# Patient Record
Sex: Male | Born: 1955 | Race: White | Hispanic: No | Smoking: Never smoker
Health system: Southern US, Community
[De-identification: ages and names within clinical notes are randomized; demographics above are authoritative.]

## PROBLEM LIST (undated history)

## (undated) DIAGNOSIS — B182 Chronic viral hepatitis C: Secondary | ICD-10-CM

## (undated) DIAGNOSIS — G8929 Other chronic pain: Secondary | ICD-10-CM

## (undated) DIAGNOSIS — K7460 Unspecified cirrhosis of liver: Secondary | ICD-10-CM

## (undated) DIAGNOSIS — N200 Calculus of kidney: Secondary | ICD-10-CM

## (undated) DIAGNOSIS — I10 Essential (primary) hypertension: Secondary | ICD-10-CM

## (undated) DIAGNOSIS — M549 Dorsalgia, unspecified: Secondary | ICD-10-CM

## (undated) HISTORY — DX: Essential (primary) hypertension: I10

## (undated) HISTORY — DX: Other chronic pain: G89.29

## (undated) HISTORY — DX: Dorsalgia, unspecified: M54.9

## (undated) HISTORY — PX: ANTERIOR CERVICAL DISCECTOMY: SHX1160

## (undated) HISTORY — DX: Morbid (severe) obesity due to excess calories: E66.01

## (undated) HISTORY — DX: Chronic viral hepatitis C: B18.2

## (undated) HISTORY — DX: Calculus of kidney: N20.0

## (undated) HISTORY — DX: Unspecified cirrhosis of liver: K74.60

---
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
## 2009-05-12 ENCOUNTER — Ambulatory Visit (HOSPITAL_COMMUNITY)
Admission: RE | Admit: 2009-05-12 | Discharge: 2009-05-12 | Payer: Self-pay | Source: Home / Self Care | Admitting: Neurosurgery

## 2010-08-18 ENCOUNTER — Other Ambulatory Visit (HOSPITAL_COMMUNITY): Payer: Self-pay | Admitting: Orthopedic Surgery

## 2010-08-18 ENCOUNTER — Encounter (HOSPITAL_COMMUNITY)
Admission: RE | Admit: 2010-08-18 | Discharge: 2010-08-18 | Disposition: A | Discharge status: Home / Self Care | Payer: Worker's Compensation | Source: Ambulatory Visit | Attending: Orthopedic Surgery | Admitting: Orthopedic Surgery

## 2010-08-18 ENCOUNTER — Ambulatory Visit (HOSPITAL_COMMUNITY)
Admission: RE | Admit: 2010-08-18 | Discharge: 2010-08-18 | Disposition: A | Discharge status: Home / Self Care | Payer: Worker's Compensation | Source: Ambulatory Visit | Attending: Orthopedic Surgery | Admitting: Orthopedic Surgery

## 2010-08-18 DIAGNOSIS — Z01818 Encounter for other preprocedural examination: Secondary | ICD-10-CM | POA: Insufficient documentation

## 2010-08-18 DIAGNOSIS — Z01811 Encounter for preprocedural respiratory examination: Secondary | ICD-10-CM

## 2010-08-18 DIAGNOSIS — Z01812 Encounter for preprocedural laboratory examination: Secondary | ICD-10-CM | POA: Insufficient documentation

## 2010-08-18 LAB — CBC
HCT: 46 % (ref 39.0–52.0)
Hemoglobin: 16.2 g/dL (ref 13.0–17.0)
MCV: 85.2 fL (ref 78.0–100.0)
RBC: 5.4 MIL/uL (ref 4.22–5.81)
WBC: 8.7 10*3/uL (ref 4.0–10.5)

## 2010-08-18 LAB — BASIC METABOLIC PANEL
CO2: 27 mEq/L (ref 19–32)
Chloride: 102 mEq/L (ref 96–112)
GFR calc Af Amer: >60 mL/min (ref >60)
Sodium: 139 mEq/L (ref 135–145)

## 2010-08-18 LAB — SURGICAL PCR SCREEN: MRSA, PCR: NEGATIVE

## 2010-08-19 ENCOUNTER — Ambulatory Visit (HOSPITAL_COMMUNITY)
Admission: RE | Admit: 2010-08-19 | Discharge: 2010-08-20 | Disposition: A | Discharge status: Home / Self Care | Payer: Worker's Compensation | Source: Ambulatory Visit | Attending: Orthopedic Surgery | Admitting: Orthopedic Surgery

## 2010-08-19 ENCOUNTER — Ambulatory Visit (HOSPITAL_COMMUNITY): Payer: Worker's Compensation

## 2010-08-19 DIAGNOSIS — Z01812 Encounter for preprocedural laboratory examination: Secondary | ICD-10-CM | POA: Insufficient documentation

## 2010-08-19 DIAGNOSIS — M47812 Spondylosis without myelopathy or radiculopathy, cervical region: Secondary | ICD-10-CM | POA: Insufficient documentation

## 2010-08-19 DIAGNOSIS — I10 Essential (primary) hypertension: Secondary | ICD-10-CM | POA: Insufficient documentation

## 2010-08-19 DIAGNOSIS — Z01818 Encounter for other preprocedural examination: Secondary | ICD-10-CM | POA: Insufficient documentation

## 2010-08-19 DIAGNOSIS — Z01810 Encounter for preprocedural cardiovascular examination: Secondary | ICD-10-CM | POA: Insufficient documentation

## 2010-08-21 NOTE — Op Note (Signed)
Jerome Snow, Jerome [name]:  [id number]  MEDICAL RECORD NO.:  [id number]  LOCATION:  5023                         FACILITY:  [facility]  PHYSICIAN:  Alvy Beal, MD    DATE OF BIRTH:  April 28, 1955  DATE OF PROCEDURE:  08/19/2010 DATE OF DISCHARGE:                              OPERATIVE REPORT   PREOPERATIVE DIAGNOSIS:  Cervical spondylotic radiculopathy.  POSTOPERATIVE DIAGNOSIS:  Cervical spondylotic radiculopathy.  OPERATIVE PROCEDURE:  Anterior cervical diskectomy and fusion C5-C6, C6- C7.  SURGEON:  Alvy Beal, MD  FIRST ASSISTANT:  Norval Gable, PA  INSTRUMENTATION SYSTEM USED:  Synthes anterior cervical Vectra plate 34 mm long with 16-mm screws into the body of C5, 14-mm rescue screws into the body of C6, 14-mm variable angle screws into the body of C7, and 8- mm tightened large lordotic intervertebral cages packed with DBX mix.  COMPLICATIONS:  None.  CONDITION:  Stable.  HISTORY:  This is a very pleasant 55 year old morbidly gentleman who has been having progressive disabling neck and radicular arm pain.  Despite prolonged attempts at conservative management, he continued to have severe disabling pain with poor quality of life.  As a result, we discussed the surgical solution to his problem.  All appropriate risks, benefits, and alternatives were discussed with the patient and consent was obtained.  OPERATIVE NOTE:  The patient was brought to the operating room and placed supine on the operating table.  After successful induction of general anesthesia and endotracheal intubation, SCDs were applied.  The arms were tucked at the side and wrist straps were applied.  The anterior cervical spine was prepped and draped in a standard fashion.  Appropriate time-out was then done to confirm patient, procedure, affected extremity, and all other pertinent important data.  Once this was done, a longitudinal left-sided incision was made just along  the medial border of the sternocleidomastoid.  Sharp dissection was carried out down to and through the platysma.  I then sharply dissected through the deep cervical fascia sweeping the trachea and ultimately esophagus medially and protecting with a finger until I could identify the omohyoid muscle.  The omohyoid muscle was transected which allowed for much easier retraction and visualization.  I continued to dissect sharply to the deep cervical fascia and the prevertebral fascia. I then placed a retractor to retract the esophagus and trachea medially on the right-hand side and then identified the carotid pulsation and protected the carotid sheath with a finger on the lateral side.  I then continued to mobilize the prevertebral fascia to completely see the anterior longitudinal ligament from the midbody of C5 to midbody of C7. I then placed a needle into the C5-C6 disk space, took a lateral intraoperative x-ray, and confirmed that I was at the appropriate level. Once I had confirmed I was at the appropriate level, I then proceeded with mobilizing the longus colli muscle.  Using bipolar electrocautery, I mobilized the longus colli muscle from the midbody of C5 to the midbody of C7 bilaterally.  I then placed a 55-mm self-retaining Caspar retractor underneath the longus colli muscle and then deflated the endotracheal cuff, expanded the retractor blades, and then reinflated  the endotracheal cuff.  At this point, I had excellent visualization of the anterior cervical spine.  Using a double-action Leksell rongeur, I removed the very large anterior osteophytes at the disk space C5-C6 and C6-C7.  At this point with the anterior cervical spine visualized, I placed distraction pins into the body of C6 and C7 and gently distracted the C6-C7 disk space.  A #15 blade scalpel was used to perform an annulotomy.  Then using a combination of pituitary rongeurs, curettes, and Kerrison rongeurs, I removed  all the disk material at the C5-C6 disk space.  Once this was completed, I then used a fine nerve hook to develop a plane underneath the posterior longitudinal ligament and I used a 1-mm Kerrison to completely resect the posterior longitudinal ligament.  At this point, I could now sweep underneath the longus colli muscle with my nerve hook and removed any remaining soft disk herniation.  I also then took my 1- mm Kerrison, went underneath the uncovertebral joint and trimmed down the osteophyte.  At this point, I irrigated copiously with normal saline, rasped the endplates until I had bleeding subchondral bone.  I was quite pleased with the decompression at C6-C7 and I was able to place a large 8-mm lordotic Titan cage packed with 2.5 mL of Osteocel. I had good compression.  At this point, I removed the distraction pin at C7, repositioned it at C5, and using the same technique completely resected the C5-C6 disk.  Again, I took down the posterior longitudinal ligament.  I was able to clearly sweep behind the vertebral body, resected any residual osteophyte.  Once I was satisfied with the decompression, I rasped the endplates and placed the same sized graft with 2.5 mL of Osteocel into the cage.  Once both cages were properly positioned, I irrigated the wound copiously with normal saline and then contoured a 34-mm anterior cervical plate and secured it with 16-mm variable angle screws into the body of C5 and 14-mm screws into the body of C7.  All screws were torqued down.  I then properly locked through the capture ring of the plate.  I then placed 2 screws at C6.  I was less pleased with the purchase what they got and so I took them out and replaced them in a slightly different direction and at this time used rescue screws and got a good purchase.  I then copiously irrigated the wounds with normal saline, used bipolar electrocautery to obtain hemostasis, and maintained it with FloSeal.  I  then checked the esophagus to ensure that it did not become entrapped underneath the plate during the application of the Vectra plate.  Once I had checked hemostasis again, I returned the esophagus and trachea back to midline again confirming that it was not injured during the application of plate.  I then closed the platysma with interrupted 2-0 Vicryl suture and a 3-0 running Monocryl for the skin.  Steri-Strips and dry dressing were applied.  The patient was ultimately extubated and transferred to PACU without incident.  At the end of the case, all needle and sponge counts were correct.     Alvy Beal, MD     DDB/MEDQ  D:  08/19/2010  T:  08/20/2010  Job:  [id number]  Electronically Signed by Venita Lick MD on 08/21/2010 08:14:58 AM

## 2010-08-23 ENCOUNTER — Emergency Department (HOSPITAL_COMMUNITY): Payer: Worker's Compensation

## 2010-08-23 ENCOUNTER — Inpatient Hospital Stay (HOSPITAL_COMMUNITY)
Admission: EM | Admit: 2010-08-23 | Discharge: 2010-08-25 | DRG: 916 | Disposition: A | Discharge status: Home / Self Care | Payer: Worker's Compensation | Attending: Orthopedic Surgery | Admitting: Orthopedic Surgery

## 2010-08-23 DIAGNOSIS — Z981 Arthrodesis status: Secondary | ICD-10-CM

## 2010-08-23 DIAGNOSIS — T783XXA Angioneurotic edema, initial encounter: Principal | ICD-10-CM | POA: Diagnosis present

## 2010-08-23 DIAGNOSIS — Y831 Surgical operation with implant of artificial internal device as the cause of abnormal reaction of the patient, or of later complication, without mention of misadventure at the time of the procedure: Secondary | ICD-10-CM | POA: Diagnosis present

## 2010-08-23 DIAGNOSIS — K047 Periapical abscess without sinus: Secondary | ICD-10-CM | POA: Diagnosis present

## 2010-08-23 DIAGNOSIS — Y92009 Unspecified place in unspecified non-institutional (private) residence as the place of occurrence of the external cause: Secondary | ICD-10-CM

## 2010-08-23 LAB — CBC
MCHC: 34.9 g/dL (ref 30.0–36.0)
Platelets: 229 10*3/uL (ref 150–400)
RDW: 12.6 % (ref 11.5–15.5)
WBC: 9.5 10*3/uL (ref 4.0–10.5)

## 2010-08-23 LAB — DIFFERENTIAL
Basophils Absolute: 0 10*3/uL (ref 0.0–0.1)
Eosinophils Absolute: 0.1 10*3/uL (ref 0.0–0.7)
Eosinophils Relative: 1 % (ref 0–5)
Lymphocytes Relative: 16 % (ref 12–46)
Lymphs Abs: 1.5 10*3/uL (ref 0.7–4.0)
Neutro Abs: 7.1 10*3/uL (ref 1.7–7.7)
Neutrophils Relative %: 75 % (ref 43–77)

## 2010-08-23 LAB — BASIC METABOLIC PANEL
BUN: 13 mg/dL (ref 6–23)
CO2: 30 mEq/L (ref 19–32)
Calcium: 9.3 mg/dL (ref 8.4–10.5)
Chloride: 98 mEq/L (ref 96–112)
Creatinine, Ser: 0.67 mg/dL (ref 0.50–1.35)
Glucose, Bld: 120 mg/dL — ABNORMAL HIGH (ref 70–99)
Potassium: 4 mEq/L (ref 3.5–5.1)

## 2010-08-23 MED ORDER — IOHEXOL 300 MG/ML  SOLN: 75.0000 mL | Freq: Once | INTRAMUSCULAR | Status: AC | PRN

## 2010-09-09 NOTE — Discharge Summary (Signed)
  [name], [name]:  [id number]  MEDICAL RECORD NO.:  [id number]  LOCATION:  5038                         FACILITY:  [facility]  PHYSICIAN:  Jerome Beal, MD    DATE OF BIRTH:  [birth year]  DATE OF ADMISSION:  08/23/2010 DATE OF DISCHARGE:  08/25/2010                              DISCHARGE SUMMARY   ADMITTING DIAGNOSIS:  Airway problems (angioedema), status post anterior cervical diskectomy and fusion.  SECONDARY DIAGNOSIS:  Abscess, right tooth.  HISTORY:  This is a very pleasant 55 year old gentleman who last week underwent a multilevel anterior cervical diskectomy and fusion.  The surgery was up and was uneventful and he was ultimately discharged to home.  He presented to the emergency room on August 23, 2010 with complaints of difficulty breathing, shortness of breath, and difficulty swallowing.  CT scan at that time demonstrated airway angioedema, but no significant hematoma or external compression.  As a result, we admitted the patient for ICU observation and ongoing care.  The patient's past medical, surgical, family, and social history is outlined in his H and P and intake form, please refer to those visits.  HOSPITAL COURSE:  The patient was doing quite well.  He was transferred from the Step-Down Unit to the floor.  There has been no further episodes of swelling or airway compromise or difficulty breathing or shortness of breath.  The patient was complaining of significant mouth bleeding and he informed that he had an abscess tooth.  I attempted to get dental consult; however, this was quite difficult to get an inpatient dental consult.  I ultimately spoke to Dr. Chales Snow, oral surgeon and he advised giving the patient oral antibiotics and arrange follow up as an outpatient here with tooth.  The patient was okay with this.  Given the fact that his airway compromise had resolved, I will discharge him home today.  He will follow up with me as  planned.  No change to his discharge instructions with respect to his cervical spine. He will continue with all of his preoperative pain control meds and the only additional med will use today would be Keflex 500 mg p.o. q.i.d. for 10 days.     Jerome Beal, MD     DDB/MEDQ  D:  08/25/2010  T:  08/26/2010  Job:  [id number]  Electronically Signed by Jerome Lick MD on 09/09/2010 11:26:51 AM

## 2010-09-09 NOTE — H&P (Signed)
Jerome Snow, Jerome [name]:  [id number]  MEDICAL RECORD NO.:  [id number]  LOCATION:  [facility]                         FACILITY:  [facility]  PHYSICIAN:  Alvy Beal, MD    DATE OF BIRTH:  11/10/1955  DATE OF ADMISSION:  08/23/2010 DATE OF DISCHARGE:                             HISTORY & PHYSICAL   ADMITTING DIAGNOSES:  Airway angioedema status post anterior cervical diskectomy and fusion.  HISTORY:  This is a very pleasant 55 year old morbidly obese gentleman who last week underwent an anterior cervical diskectomy and fusion at C5- 6, C6-7.  Postoperatively he was doing quite well.  There were no significant complications.  He had no difficulty eating, swallowing, or breathing.  He was neurologically intact.  He was subsequently discharged to home.  Sunday postoperative day #3, he started noticing some difficulty swallowing and difficulty breathing.  The patient presented to the emergency room at approximately 4 a.m.  My partner Dr. Jillyn Hidden was notified and I was called to evaluate the patient.  Upon arrival to the emergency room, the patient noted that he was improving.  When he was sitting up, his breathing was better and there was less difficulty swallowing.  He still noted some issues when he would lay flat.  As a result of these findings, decision was made to admit the patient for further treatment.  His past medical, surgical family, social history includes morbid obesity.  He is a nonsmoker, nondrinker.  He has had chronic neck and back pain.  He is 4 days out from an anterior cervical diskectomy and fusion.  No drug allergies.  He is currently taking muscle relaxer, oxycodone, Colace.  CLINICAL EXAMINATION:  He is a pleasant gentleman who appears his stated age in no acute distress.  He is alert and oriented x3.  He is not having any labored breathing.  There is no stridor.  He is comfortable in the sitting position.  He has a regular rate and rhythm.   No tachycardia.  Respiration rate is 18 and normal.  His cervical incision site is clean, dry, and intact.  There is no significant drainage. There is some minor tenderness with direct palpation but no erythema. No expanding hematoma noted.  Abdomen is soft and nontender.  He has 5/5 strength in the upper extremity.  No shoulder, elbow, wrist pain with joint range of motion.  Negative Babinski test.  Symmetrical 2+ deep tendon reflexes.  Compartments are soft and nontender.  Intact peripheral pulses throughout.  CT scan was done of his cervical spine with contrast and I reviewed it with the radiologist.  The patient has only a very small fluid collection anterior to the C5 vertebral body, but no significant compression of the esophagus or trachea.  He does have significant angioedema with significant closure of the trachea at the level of about C4-5 disk, C4 vertebral body.  At this point in time, the patient has what appears to be angioedema. There does not appear to be any significant compressive force.  Given the fact there is no significant hematoma to actually decompress, I discussed with the patient and elected to observe him.  However, I am concerned about  the potential of an airway complication.  Given this, I am  going to admit the patient to the step-down bed, put him on tele and start IV Decadron.  My hope is to monitor his clinical exam.  As long as he is comfortable, he has no signs of labored breathing and he is tolerating a liquid diet, I think observation is adequate.  We will put an emergency trache set at the bedside in case he loses his airway and the trachea be done emergently.  I will notify the Anesthesia Service so that if there is an emergency they are at least aware of the patient.     Alvy Beal, MD     DDB/MEDQ  D:  08/23/2010  T:  08/23/2010  Job:  [id number]  Electronically Signed by Venita Lick MD on 09/09/2010 11:26:57 AM

## 2011-03-08 ENCOUNTER — Other Ambulatory Visit (HOSPITAL_COMMUNITY): Payer: Self-pay | Admitting: Chiropractic Medicine

## 2011-03-08 DIAGNOSIS — R52 Pain, unspecified: Secondary | ICD-10-CM

## 2011-03-09 ENCOUNTER — Other Ambulatory Visit (HOSPITAL_COMMUNITY): Payer: Self-pay | Admitting: Chiropractic Medicine

## 2011-03-09 DIAGNOSIS — R52 Pain, unspecified: Secondary | ICD-10-CM

## 2011-03-12 ENCOUNTER — Other Ambulatory Visit (HOSPITAL_COMMUNITY): Payer: Self-pay

## 2011-03-13 ENCOUNTER — Other Ambulatory Visit (HOSPITAL_COMMUNITY): Payer: Self-pay

## 2015-09-24 DIAGNOSIS — M5440 Lumbago with sciatica, unspecified side: Secondary | ICD-10-CM | POA: Diagnosis not present

## 2015-09-24 DIAGNOSIS — I10 Essential (primary) hypertension: Secondary | ICD-10-CM | POA: Diagnosis not present

## 2015-09-24 DIAGNOSIS — Z136 Encounter for screening for cardiovascular disorders: Secondary | ICD-10-CM | POA: Diagnosis not present

## 2015-10-16 DIAGNOSIS — Z1212 Encounter for screening for malignant neoplasm of rectum: Secondary | ICD-10-CM | POA: Diagnosis not present

## 2015-10-16 DIAGNOSIS — Z125 Encounter for screening for malignant neoplasm of prostate: Secondary | ICD-10-CM | POA: Diagnosis not present

## 2015-10-16 DIAGNOSIS — M5440 Lumbago with sciatica, unspecified side: Secondary | ICD-10-CM | POA: Diagnosis not present

## 2015-10-16 DIAGNOSIS — M25572 Pain in left ankle and joints of left foot: Secondary | ICD-10-CM | POA: Diagnosis not present

## 2015-10-16 DIAGNOSIS — Z0000 Encounter for general adult medical examination without abnormal findings: Secondary | ICD-10-CM | POA: Diagnosis not present

## 2015-10-21 DIAGNOSIS — M7989 Other specified soft tissue disorders: Secondary | ICD-10-CM | POA: Diagnosis not present

## 2015-10-21 DIAGNOSIS — M25572 Pain in left ankle and joints of left foot: Secondary | ICD-10-CM | POA: Diagnosis not present

## 2015-11-09 DIAGNOSIS — G8929 Other chronic pain: Secondary | ICD-10-CM | POA: Diagnosis not present

## 2015-11-09 DIAGNOSIS — G894 Chronic pain syndrome: Secondary | ICD-10-CM | POA: Diagnosis not present

## 2015-11-09 DIAGNOSIS — Z79899 Other long term (current) drug therapy: Secondary | ICD-10-CM | POA: Diagnosis not present

## 2015-11-09 DIAGNOSIS — I10 Essential (primary) hypertension: Secondary | ICD-10-CM | POA: Diagnosis not present

## 2015-12-07 DIAGNOSIS — M542 Cervicalgia: Secondary | ICD-10-CM | POA: Diagnosis not present

## 2015-12-07 DIAGNOSIS — I10 Essential (primary) hypertension: Secondary | ICD-10-CM | POA: Diagnosis not present

## 2015-12-07 DIAGNOSIS — G8929 Other chronic pain: Secondary | ICD-10-CM | POA: Diagnosis not present

## 2015-12-07 DIAGNOSIS — G894 Chronic pain syndrome: Secondary | ICD-10-CM | POA: Diagnosis not present

## 2017-05-10 ENCOUNTER — Other Ambulatory Visit (HOSPITAL_COMMUNITY): Payer: Self-pay | Admitting: Nurse Practitioner

## 2017-05-10 DIAGNOSIS — B182 Chronic viral hepatitis C: Secondary | ICD-10-CM

## 2017-05-26 ENCOUNTER — Ambulatory Visit
Admission: RE | Admit: 2017-05-26 | Discharge: 2017-05-26 | Disposition: A | Discharge status: Home / Self Care | Payer: Medicare Other | Source: Ambulatory Visit | Attending: Nurse Practitioner | Admitting: Nurse Practitioner

## 2017-05-26 DIAGNOSIS — B182 Chronic viral hepatitis C: Secondary | ICD-10-CM

## 2017-06-14 ENCOUNTER — Encounter: Payer: Self-pay | Admitting: Internal Medicine

## 2017-06-14 LAB — COLOGUARD

## 2017-06-29 ENCOUNTER — Encounter: Payer: Self-pay | Admitting: Internal Medicine

## 2017-08-25 ENCOUNTER — Ambulatory Visit (INDEPENDENT_AMBULATORY_CARE_PROVIDER_SITE_OTHER): Payer: Medicare Other | Admitting: Internal Medicine

## 2017-08-25 ENCOUNTER — Encounter (INDEPENDENT_AMBULATORY_CARE_PROVIDER_SITE_OTHER): Payer: Self-pay

## 2017-08-25 ENCOUNTER — Encounter: Payer: Self-pay | Admitting: Internal Medicine

## 2017-08-25 VITALS — BP 120/80 | HR 88 | Ht 70.75 in | Wt 334.2 lb

## 2017-08-25 DIAGNOSIS — K7460 Unspecified cirrhosis of liver: Secondary | ICD-10-CM | POA: Diagnosis not present

## 2017-08-25 DIAGNOSIS — B182 Chronic viral hepatitis C: Secondary | ICD-10-CM | POA: Diagnosis not present

## 2017-08-25 DIAGNOSIS — R195 Other fecal abnormalities: Secondary | ICD-10-CM

## 2017-08-25 MED ORDER — SOD PICOSULFATE-MAG OX-CIT ACD 10-3.5-12 MG-GM -GM/160ML PO SOLN: 1.0000 | ORAL | 0 refills | Status: DC

## 2017-08-25 NOTE — Patient Instructions (Addendum)
You have been scheduled for an endoscopy and colonoscopy. Please follow the written instructions given to you at your visit today. Please pick up your prep supplies at the pharmacy. If you use inhalers (even only as needed), please bring them with you on the day of your procedure.   I appreciate the opportunity to care for you. Carl Gessner, MD, FACG 
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
## 2017-08-27 ENCOUNTER — Encounter: Payer: Self-pay | Admitting: Internal Medicine

## 2017-08-27 DIAGNOSIS — K7460 Unspecified cirrhosis of liver: Secondary | ICD-10-CM

## 2017-08-27 DIAGNOSIS — B182 Chronic viral hepatitis C: Secondary | ICD-10-CM | POA: Insufficient documentation

## 2017-08-28 ENCOUNTER — Ambulatory Visit (AMBULATORY_SURGERY_CENTER): Payer: Medicare Other | Admitting: Gastroenterology

## 2017-08-28 ENCOUNTER — Encounter: Payer: Self-pay | Admitting: Gastroenterology

## 2017-08-28 VITALS — BP 151/82 | HR 79 | Temp 97.8°F | Resp 14 | Ht 70.75 in | Wt 334.0 lb

## 2017-08-28 DIAGNOSIS — R195 Other fecal abnormalities: Secondary | ICD-10-CM

## 2017-08-28 DIAGNOSIS — D124 Benign neoplasm of descending colon: Secondary | ICD-10-CM | POA: Diagnosis not present

## 2017-08-28 DIAGNOSIS — D123 Benign neoplasm of transverse colon: Secondary | ICD-10-CM

## 2017-08-28 DIAGNOSIS — I8500 Esophageal varices without bleeding: Secondary | ICD-10-CM

## 2017-08-28 MED ORDER — SODIUM CHLORIDE 0.9 % IV SOLN: 500.0000 mL | Freq: Once | INTRAVENOUS | Status: AC

## 2017-08-28 NOTE — Progress Notes (Signed)
Called to room to assist during endoscopic procedure.  Patient ID and intended procedure confirmed with present staff. Received instructions for my participation in the procedure from the performing physician.  

## 2017-08-28 NOTE — Progress Notes (Signed)
Report given to PACU, vss 

## 2017-08-28 NOTE — Op Note (Signed)
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 Medicines:                Monitored Anesthesia Care Procedure:                Pre-Anesthesia Assessment:                           - Prior to the procedure, a History and Physical                            was performed, and patient medications and                            allergies were reviewed. The patient is competent.                            The risks and benefits of the procedure and the                            sedation options and risks were discussed with the                            patient. All questions were answered and informed                            consent was obtained. Patient identification and                            proposed procedure were verified by the physician                            in the procedure room. Mental Status Examination:                            alert and oriented. Prophylactic Antibiotics: The                            patient does not require prophylactic antibiotics.                            Prior Anticoagulants: The patient has taken no  previous anticoagulant or antiplatelet agents. ASA                            Grade Assessment: III - A patient with severe                            systemic disease. After reviewing the risks and                            benefits, the patient was deemed in satisfactory                            condition to undergo the procedure. The anesthesia                            plan was to use monitored  anesthesia care (MAC).                            Immediately prior to administration of medications,                            the patient was re-assessed for adequacy to receive                            sedatives. The heart rate, respiratory rate, oxygen                            saturations, blood pressure, adequacy of pulmonary                            ventilation, and response to care were monitored                            throughout the procedure. The physical status of                            the patient was re-assessed after the procedure.                           After obtaining informed consent, the endoscope was                            passed under direct vision. Throughout the                            procedure, the patient's blood pressure, pulse, and                            oxygen saturations were monitored continuously. The                            Endoscope was introduced through the mouth, and  advanced to the second part of duodenum. The upper                            GI endoscopy was accomplished without difficulty.                            The patient tolerated the procedure well. Scope In: Scope Out: Findings:                 LA Grade A (one or more mucosal breaks less than 5                            mm, not extending between tops of 2 mucosal folds)                            esophagitis with no bleeding was found.                           The exam was otherwise without abnormality. No                            esophageal or fundal varices. Complications:            No immediate complications. Estimated Blood Loss:     Estimated blood loss: none. Impression:               - LA Grade A reflux esophagitis.                           - The examination was otherwise normal.                           - No esophageal or fundal varices. Recommendation:           - Patient has a contact number available for                             emergencies. The signs and symptoms of potential                            delayed complications were discussed with the                            patient. Return to normal activities tomorrow.                            Written discharge instructions were provided to the                            patient.                           - Resume previous diet.                           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 Until then he can try                            Zantac 150 mg by mouth once a day if he has any                            heartburn.                           - Repeat upper endoscopy in 3 years for screening                            purposes.                           - Return to GI clinic PRN. Jackquline Denmark, MD 08/28/2017 3:44:29 PM This report has been signed electronically.

## 2017-08-28 NOTE — Progress Notes (Signed)
Pt. Reports no change in his medical or surgical history since his pre-visit 08/25/2017.

## 2017-08-28 NOTE — Patient Instructions (Signed)
Handouts given:  Hemorrhoids, Polyps, and Diverticulosis  YOU HAD AN ENDOSCOPIC PROCEDURE TODAY AT [facility]:   Refer to the procedure report that was given to you for any specific questions about what was found during the examination.  If the procedure report does not answer your questions, please call your gastroenterologist to clarify.  If you requested that your care partner not be given the details of your procedure findings, then the procedure report has been included in a sealed envelope for you to review at your convenience later.  YOU SHOULD EXPECT: Some feelings of bloating in the abdomen. Passage of more gas than usual.  Walking can help get rid of the air that was put into your GI tract during the procedure and reduce the bloating. If you had a lower endoscopy (such as a colonoscopy or flexible sigmoidoscopy) you may notice spotting of blood in your stool or on the toilet paper. If you underwent a bowel prep for your procedure, you may not have a normal bowel movement for a few days.  Please Note:  You might notice some irritation and congestion in your nose or some drainage.  This is from the oxygen used during your procedure.  There is no need for concern and it should clear up in a day or so.  SYMPTOMS TO REPORT IMMEDIATELY:   Following lower endoscopy (colonoscopy or flexible sigmoidoscopy):  Excessive amounts of blood in the stool  Significant tenderness or worsening of abdominal pains  Swelling of the abdomen that is new, acute  Fever of 100F or higher   Following upper endoscopy (EGD)  Vomiting of blood or coffee ground material  New chest pain or pain under the shoulder blades  Painful or persistently difficult swallowing  New shortness of breath  Fever of 100F or higher  Black, tarry-looking stools  For urgent or emergent issues, a gastroenterologist can be reached at any hour by calling [phone].   DIET:  We do recommend a small meal at  first, but then you may proceed to your regular diet.  Drink plenty of fluids but you should avoid alcoholic beverages for 24 hours.  ACTIVITY:  You should plan to take it easy for the rest of today and you should NOT DRIVE or use heavy machinery until tomorrow (because of the sedation medicines used during the test).    FOLLOW UP: Our staff will call the number listed on your records the next business day following your procedure to check on you and address any questions or concerns that you may have regarding the information given to you following your procedure. If we do not reach you, we will leave a message.  However, if you are feeling well and you are not experiencing any problems, there is no need to return our call.  We will assume that you have returned to your regular daily activities without incident.  If any biopsies were taken you will be contacted by phone or by letter within the next 1-3 weeks.  Please call us at [phone] if you have not heard about the biopsies in 3 weeks.    SIGNATURES/CONFIDENTIALITY: You and/or your care partner have signed paperwork which will be entered into your electronic medical record.  These signatures attest to the fact that that the information above on your After Visit Summary has been reviewed and is understood.  Full responsibility of the confidentiality of this discharge information lies with you and/or your care-partner.

## 2017-08-28 NOTE — Op Note (Signed)
[place] Patient Name: Jerome Snow Procedure Date: 08/28/2017 2:32 PM MRN: [id number] Endoscopist: Jackquline Denmark , MD Age: 62 Referring MD:  Date of Birth: 14-Mar-1955 Gender: Male Account #: [id number] Procedure:                Colonoscopy Indications:              Positive Cologuard test Medicines:                Monitored Anesthesia Care Procedure:                Pre-Anesthesia Assessment:                           - Prior to the procedure, a History and Physical                            was performed, and patient medications and                            allergies were reviewed. The patient is competent.                            The risks and benefits of the procedure and the                            sedation options and risks were discussed with the                            patient. All questions were answered and informed                            consent was obtained. Patient identification and                            proposed procedure were verified by the physician                            in the procedure room. Mental Status Examination:                            alert and oriented. Prophylactic Antibiotics: The                            patient does not require prophylactic antibiotics.                            Prior Anticoagulants: The patient has taken no                            previous anticoagulant or antiplatelet agents. ASA                            Grade Assessment: III - A patient with severe  systemic disease. After reviewing the risks and                            benefits, the patient was deemed in satisfactory                            condition to undergo the procedure. The anesthesia                            plan was to use monitored anesthesia care (MAC).                            Immediately prior to administration of medications,                            the patient was re-assessed for  adequacy to receive                            sedatives. The heart rate, respiratory rate, oxygen                            saturations, blood pressure, adequacy of pulmonary                            ventilation, and response to care were monitored                            throughout the procedure. The physical status of                            the patient was re-assessed after the procedure.                           After obtaining informed consent, the colonoscope                            was passed under direct vision. Throughout the                            procedure, the patient's blood pressure, pulse, and                            oxygen saturations were monitored continuously. The                            Colonoscope was introduced through the anus and                            advanced to the the cecum, identified by                            appendiceal orifice and ileocecal valve. The  colonoscopy was performed with some difficulty                            assisted by abdominal pressure. The patient                            tolerated the procedure well. The quality of the                            bowel preparation was fair but adequate to identify                            polyps 6 mm and larger in size. Aggressive                            suctioning and aspiration was performed. There was                            some adherent stool especially in the right side of                            the colon which could not be fully washed. Overall,                            over 90-95% of the colonic mucosa was visualized                            satisfactorily. Scope In: 3:11:42 PM Scope Out: 3:35:33 PM Scope Withdrawal Time: 0 hours 16 minutes 15 seconds  Total Procedure Duration: 0 hours 23 minutes 51 seconds  Findings:                 Two sessile polyps were found in the mid transverse                            colon and  distal transverse colon. The polyps were                            6 mm in size. These polyps were removed with a cold                            snare. Resection and retrieval were complete.                           Two sessile polyps were found in the descending                            colon. The polyps were 6 mm in size. These polyps                            were removed with a cold snare. Resection and  retrieval were complete.                           A few small-mouthed diverticula were found in the                            sigmoid colon.                           Non-bleeding internal hemorrhoids were found during                            retroflexion. The hemorrhoids were small.                           The exam was otherwise without abnormality on                            direct and retroflexion views. Complications:            No immediate complications. Estimated Blood Loss:     Estimated blood loss: none. Impression:               - Colonic polyps status post polypectomy.                           - Minimal sigmoid diverticulosis                           - Non-bleeding internal hemorrhoids.                           - The examination was otherwise grossly normal on                            direct and retroflexion views. Recommendation:           - Patient has a contact number available for                            emergencies. The signs and symptoms of potential                            delayed complications were discussed with the                            patient. Return to normal activities tomorrow.                            Written discharge instructions were provided to the                            patient.                           - Resume previous diet.                           -  Continue present medications.                           - Await pathology results.                           - Repeat colonoscopy for  surveillance based on                            pathology results.                           - Return to GI clinic PRN. Jackquline Denmark, MD 08/28/2017 3:52:52 PM This report has been signed electronically.

## 2017-08-29 ENCOUNTER — Telehealth: Payer: Self-pay

## 2017-08-29 NOTE — Telephone Encounter (Signed)
  Follow up Call-  Call back number 08/28/2017  Post procedure Call Back phone  # [phone]  Permission to leave phone message Yes  Some recent data might be hidden     Patient questions:  Do you have a fever, pain , or abdominal swelling? No. Pain Score  0 *  Have you tolerated food without any problems? Yes.    Have you been able to return to your normal activities? Yes.    Do you have any questions about your discharge instructions: Diet   No. Medications  No. Follow up visit  No.  Do you have questions or concerns about your Care? No.  Actions: * If pain score is 4 or above: No action needed, pain <4.

## 2017-09-01 ENCOUNTER — Encounter: Payer: Self-pay | Admitting: Gastroenterology

## 2018-03-06 ENCOUNTER — Other Ambulatory Visit: Payer: Self-pay | Admitting: Nurse Practitioner

## 2018-03-06 DIAGNOSIS — K7469 Other cirrhosis of liver: Secondary | ICD-10-CM

## 2018-03-08 ENCOUNTER — Other Ambulatory Visit: Payer: Self-pay

## 2018-03-12 ENCOUNTER — Ambulatory Visit
Admission: RE | Admit: 2018-03-12 | Discharge: 2018-03-12 | Disposition: A | Discharge status: Home / Self Care | Payer: Medicare Other | Source: Ambulatory Visit | Attending: Nurse Practitioner | Admitting: Nurse Practitioner

## 2018-03-12 DIAGNOSIS — K7469 Other cirrhosis of liver: Secondary | ICD-10-CM

## 2019-03-27 IMAGING — US US ABDOMEN COMPLETE W/ ELASTOGRAPHY
1 series · 13 of 25 positions shown · non-contrast
Comparison: None.

CLINICAL DATA: Hepatitis-C



[Series 1: us abdomen complete w/ elastography · 0.15mm/px · 13 of 73 slices shown]
[im 1/73]
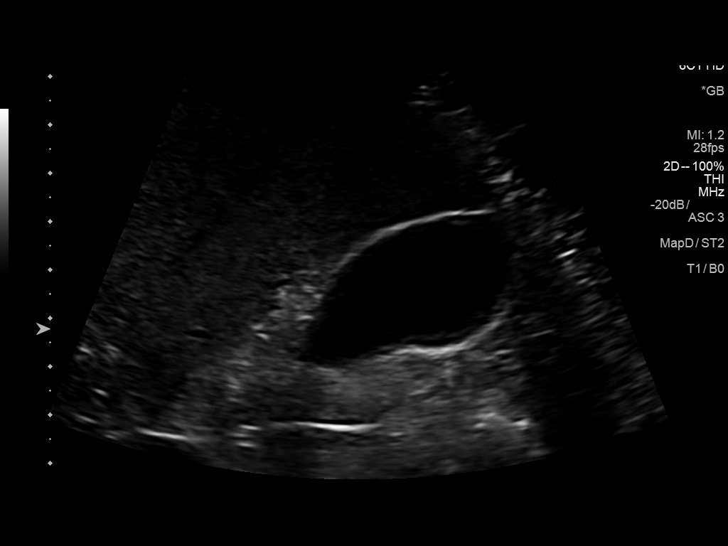
[im 7/73]
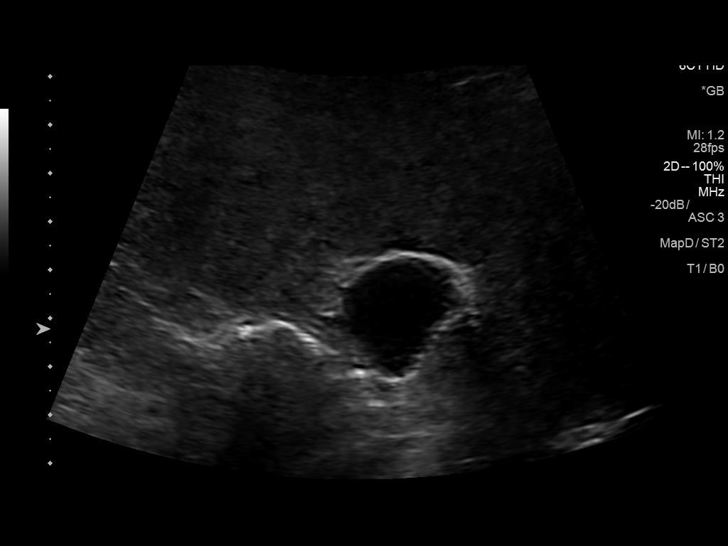
[im 13/73]
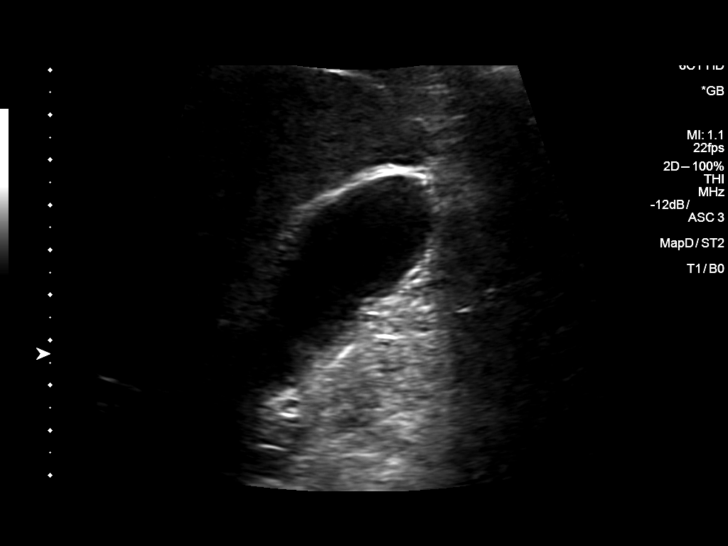
[im 19/73]
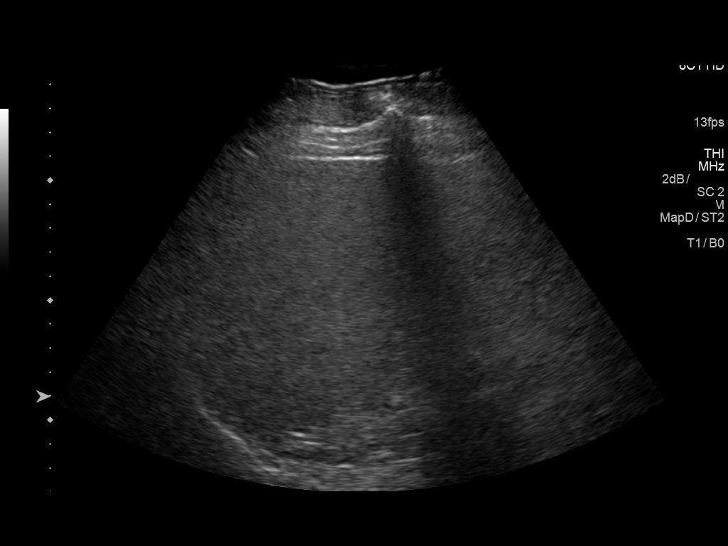
[im 25/73]
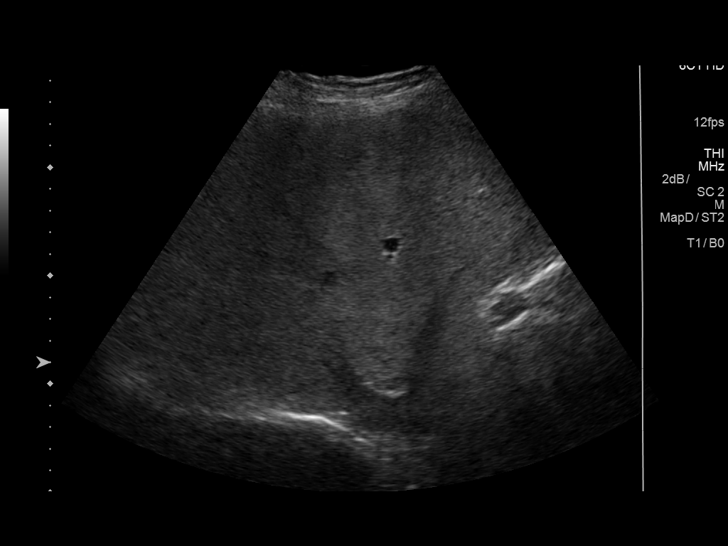
[im 31/73]
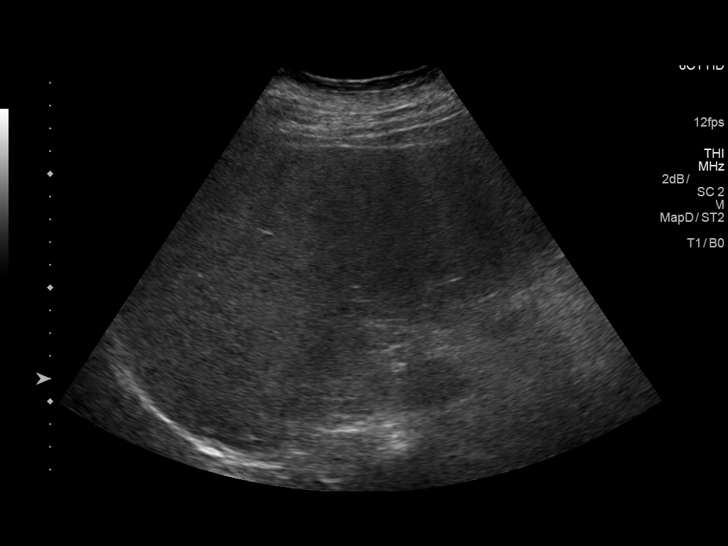
[im 37/73]
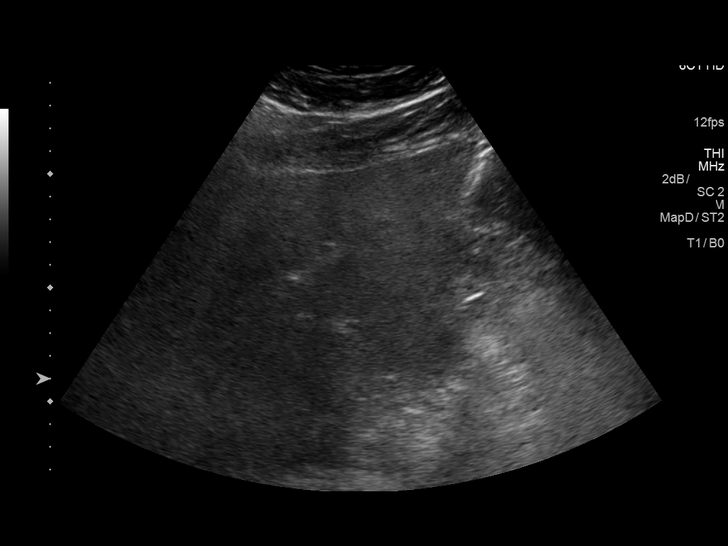
[im 43/73]
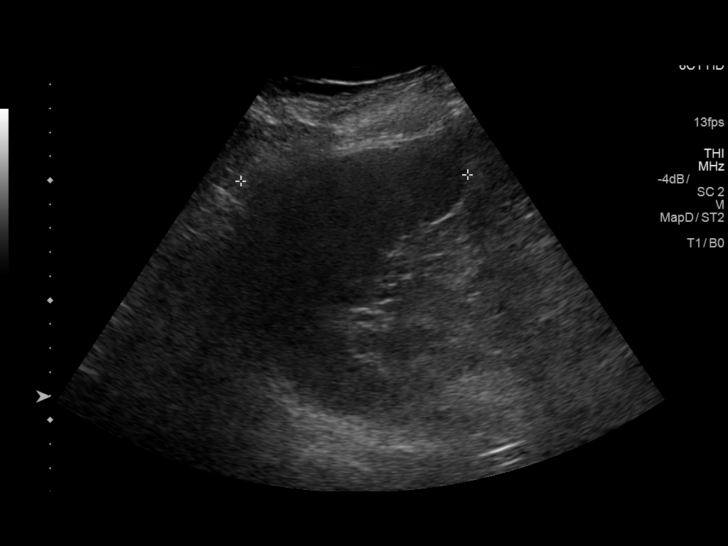
[im 49/73]
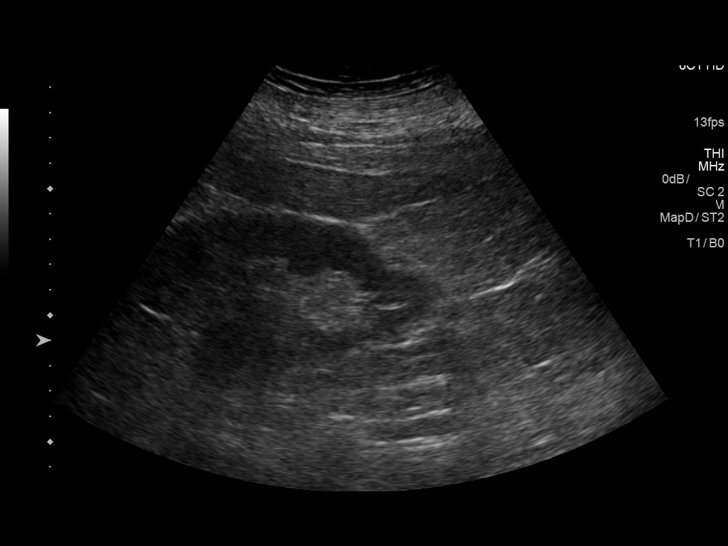
[im 55/73]
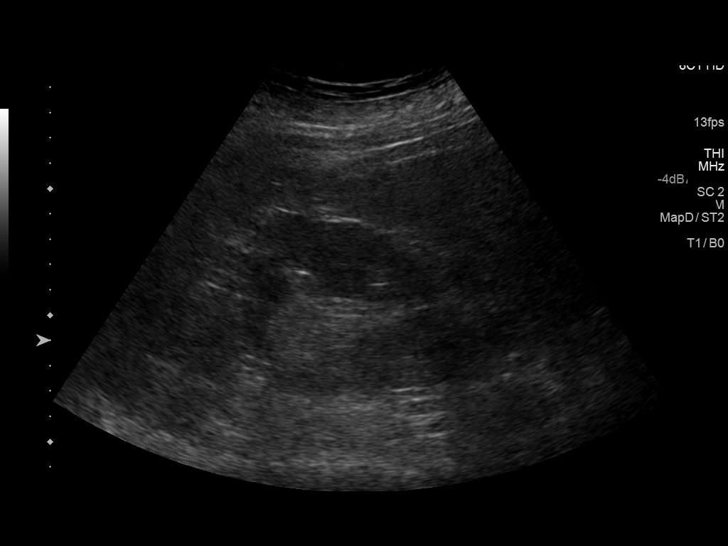
[im 61/73]
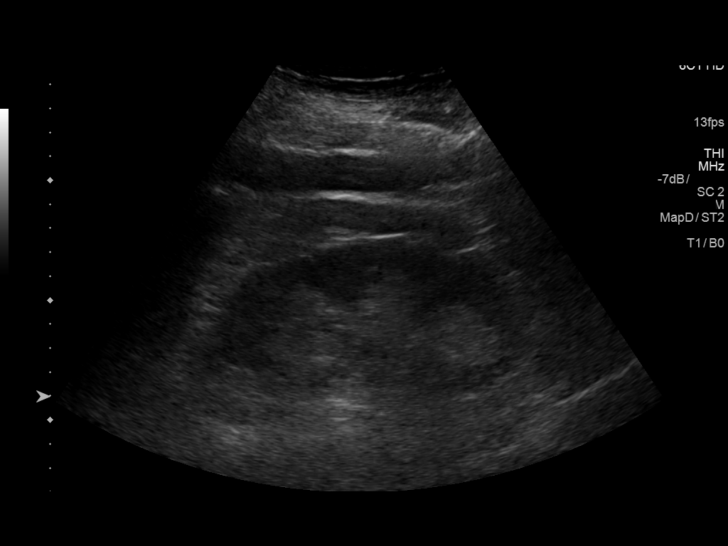
[im 67/73]
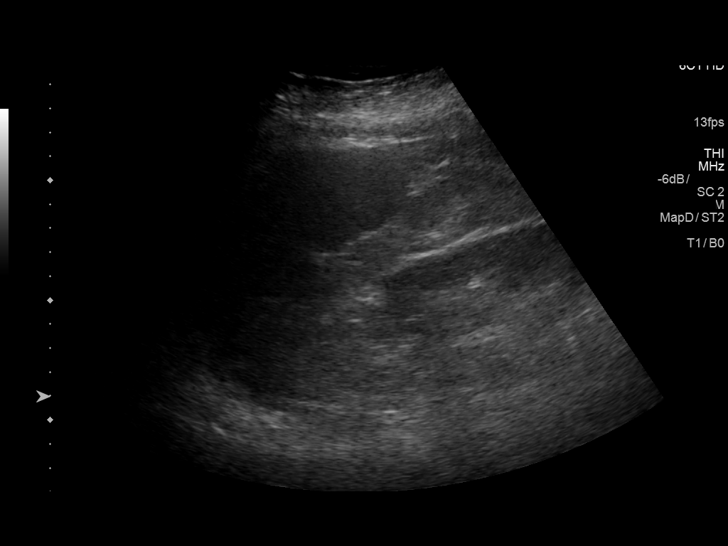
[im 73/73]
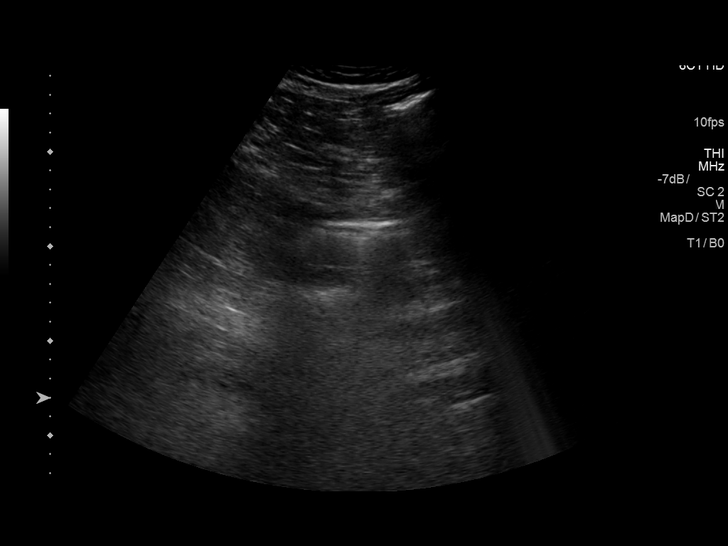

[13 of 25 positions shown; findings below may reference images not displayed]

FINDINGS: ULTRASOUND ABDOMEN

Gallbladder: No gallstones or wall thickening visualized. No
sonographic Murphy sign noted by sonographer.

Common bile duct: Diameter: Normal caliber, 3 mm

Liver: Heterogeneous, increased echotexture compatible with fatty
infiltration. No focal abnormality. Portal vein is patent on color
Doppler imaging with normal direction of blood flow towards the
liver.

IVC: No abnormality visualized.

Pancreas: Obscured by overlying bowel gas.

Spleen: Size and appearance within normal limits.

Right Kidney: Length: 14.1 cm. Echogenicity within normal limits. No
mass or hydronephrosis visualized.

Left Kidney: Length: 12.9 cm. Echogenicity within normal limits. No
mass or hydronephrosis visualized.

Abdominal aorta: No aneurysm visualized.

Other findings: None.

ULTRASOUND HEPATIC ELASTOGRAPHY

Device: Siemens Helix VTQ

Patient position: Oblique

Transducer [id number]

Number of measurements: 10

Hepatic segment:  8

Median velocity:   3.76 m/sec

IQR:

IQR/Median velocity ratio:

Corresponding Metavir fibrosis score:  Some F3 + F4

Risk of fibrosis: High

Limitations of exam: None

Please note that abnormal shear wave velocities may also be
identified in clinical settings other than with hepatic fibrosis,
such as: acute hepatitis, elevated right heart and central venous
pressures including use of beta blockers, Don Lolito disease
(Amazigh), infiltrative processes such as
mastocytosis/amyloidosis/infiltrative tumor, extrahepatic
cholestasis, in the post-prandial state, and liver transplantation.
Correlation with patient history, laboratory data, and clinical
condition recommended.
IMPRESSION: ULTRASOUND ABDOMEN:
Heterogeneous, increased echotexture throughout the liver compatible
with fatty infiltration.

ULTRASOUND HEPATIC ELASTOGRAPHY:

Median hepatic shear wave velocity is calculated at 3.76 m/sec.

Corresponding Metavir fibrosis score is Some F3 + F4.

Risk of fibrosis is High.

Follow-up: Follow up advised

## 2019-08-08 DIAGNOSIS — Z79899 Other long term (current) drug therapy: Secondary | ICD-10-CM | POA: Diagnosis not present

## 2019-08-08 DIAGNOSIS — M5440 Lumbago with sciatica, unspecified side: Secondary | ICD-10-CM | POA: Diagnosis not present

## 2019-08-08 DIAGNOSIS — R739 Hyperglycemia, unspecified: Secondary | ICD-10-CM | POA: Diagnosis not present

## 2019-08-08 DIAGNOSIS — L918 Other hypertrophic disorders of the skin: Secondary | ICD-10-CM | POA: Diagnosis not present

## 2019-08-08 DIAGNOSIS — I10 Essential (primary) hypertension: Secondary | ICD-10-CM | POA: Diagnosis not present

## 2019-11-14 DIAGNOSIS — E1169 Type 2 diabetes mellitus with other specified complication: Secondary | ICD-10-CM | POA: Diagnosis not present

## 2019-11-14 DIAGNOSIS — M5440 Lumbago with sciatica, unspecified side: Secondary | ICD-10-CM | POA: Diagnosis not present

## 2019-11-14 DIAGNOSIS — I10 Essential (primary) hypertension: Secondary | ICD-10-CM | POA: Diagnosis not present

## 2019-11-14 DIAGNOSIS — E785 Hyperlipidemia, unspecified: Secondary | ICD-10-CM | POA: Diagnosis not present

## 2019-11-14 DIAGNOSIS — Z79899 Other long term (current) drug therapy: Secondary | ICD-10-CM | POA: Diagnosis not present

## 2020-01-11 IMAGING — US US ABDOMEN LIMITED
1 series · 14 of 25 positions shown · non-contrast
Comparison: Ultrasound abdomen 05/26/2017

CLINICAL DATA: [place] cirrhosis

EXAM:
ULTRASOUND ABDOMEN LIMITED RIGHT UPPER QUADRANT

[Series 1: us abdomen limited · 0.28mm/px · 14 of 34 slices shown]
[im 1/34]
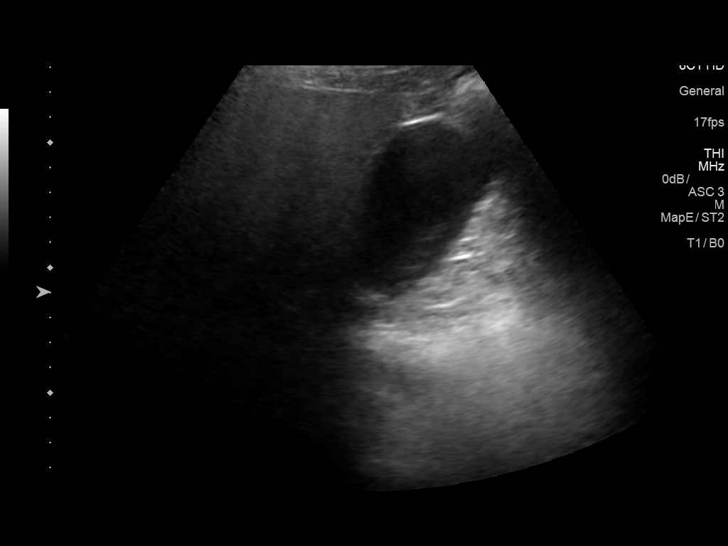
[im 3/34]
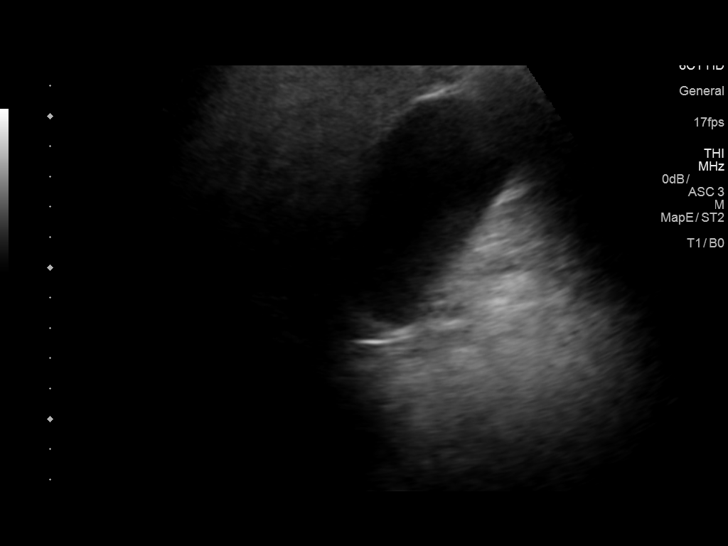
[im 6/34]
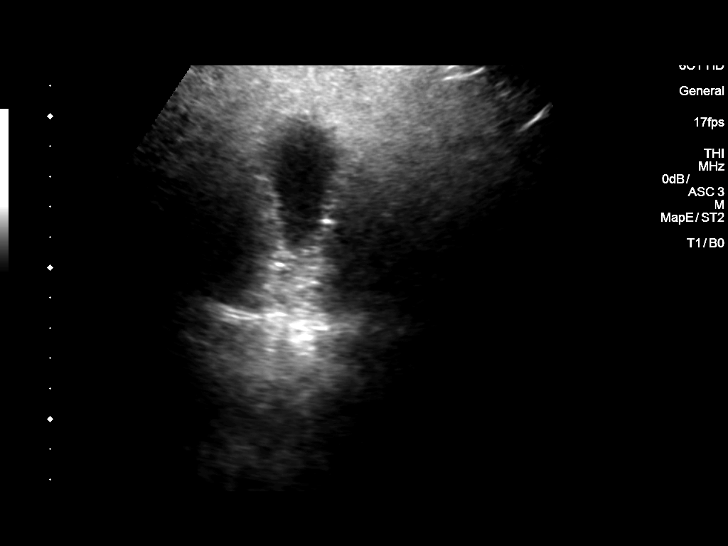
[im 9/34]
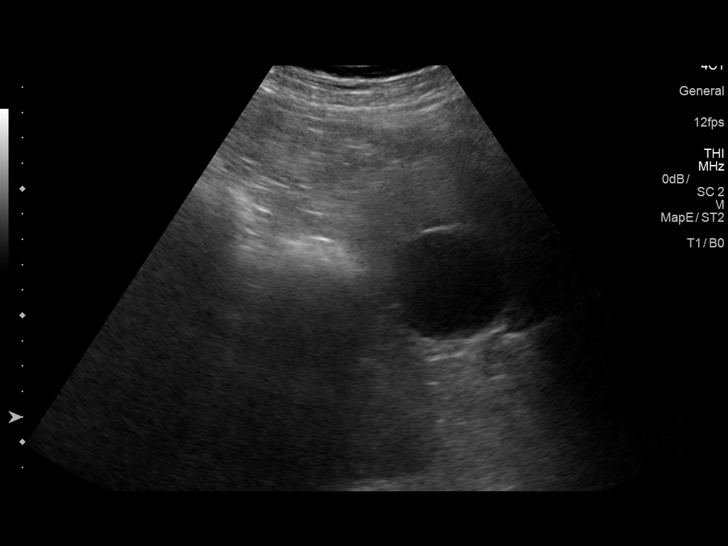
[im 12/34]
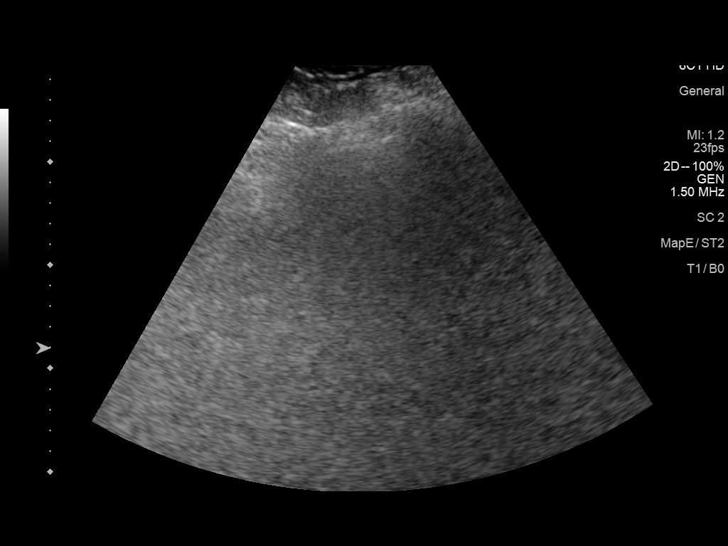
[im 13/34]
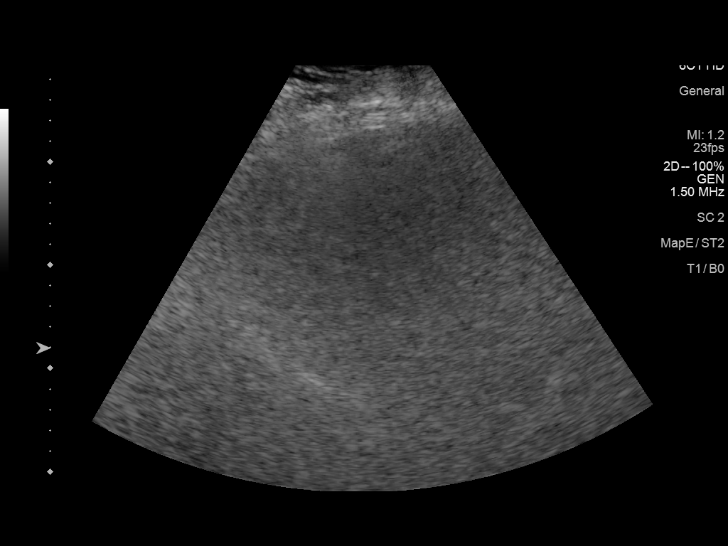
[im 16/34]
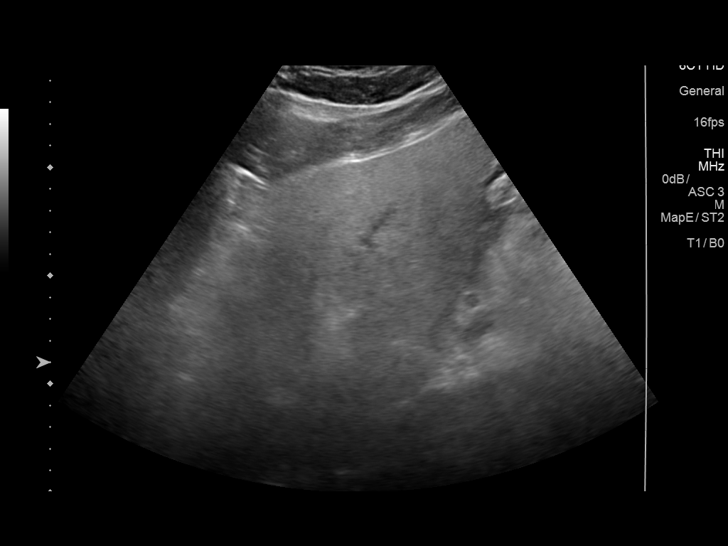
[im 18/34]
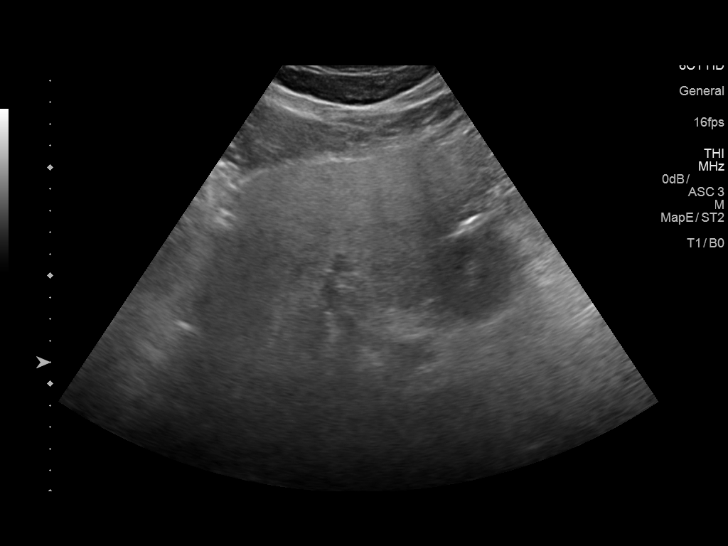
[im 21/34]
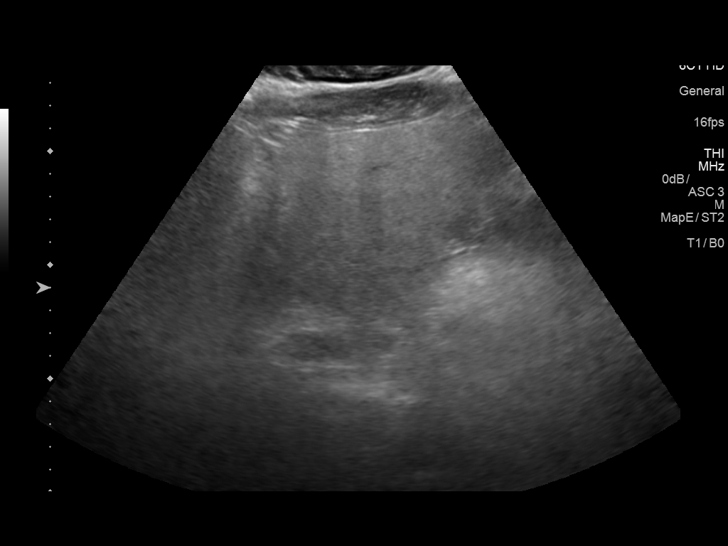
[im 23/34]
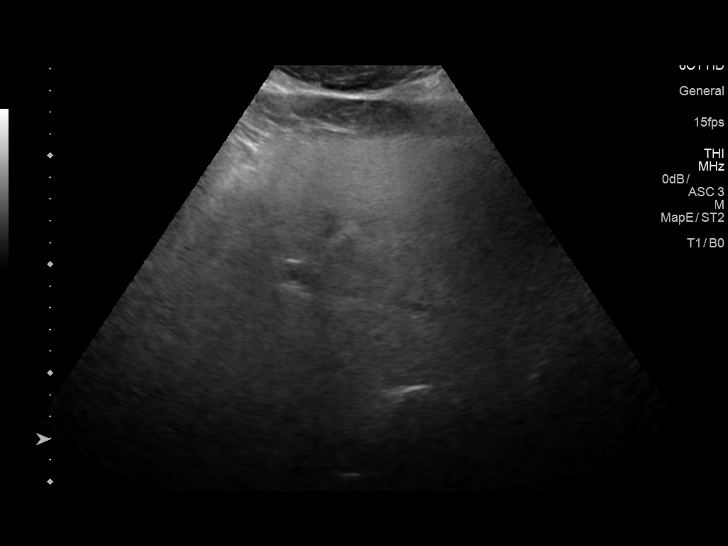
[im 25/34]
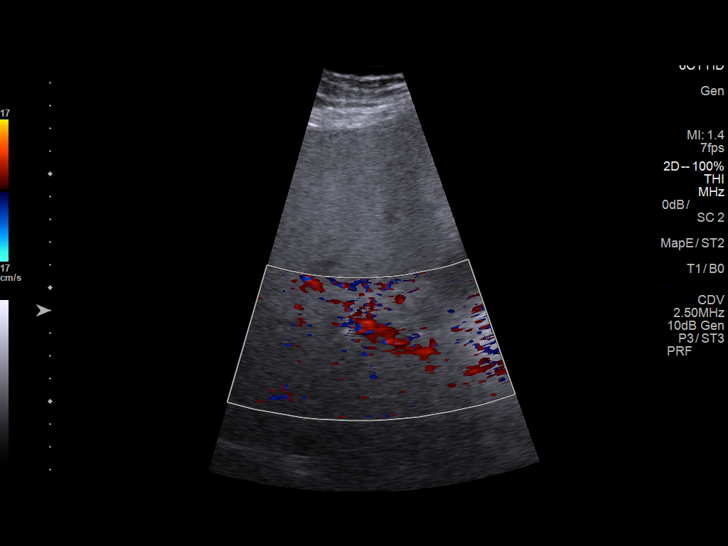
[im 28/34]
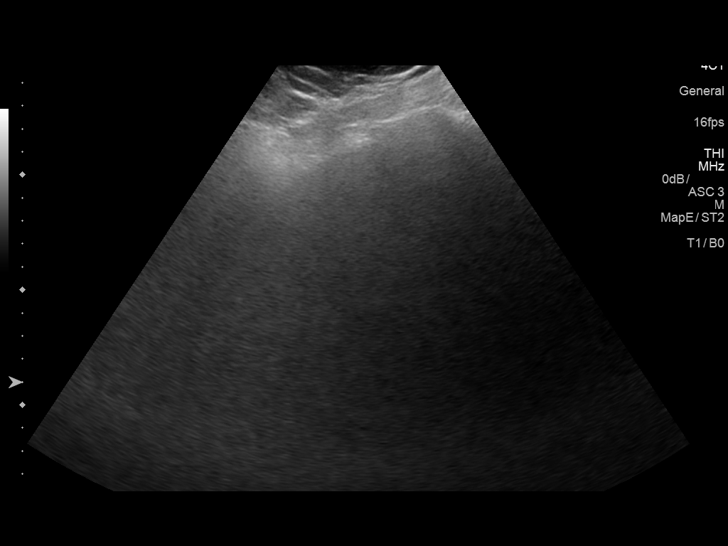
[im 31/34]
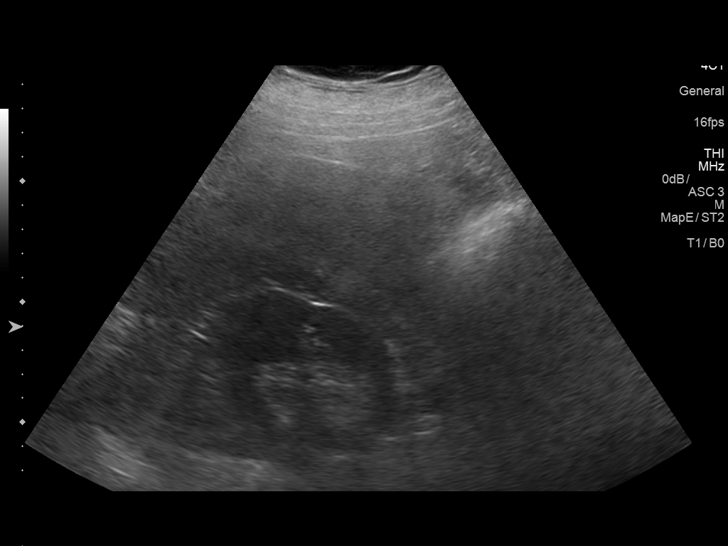
[im 34/34]
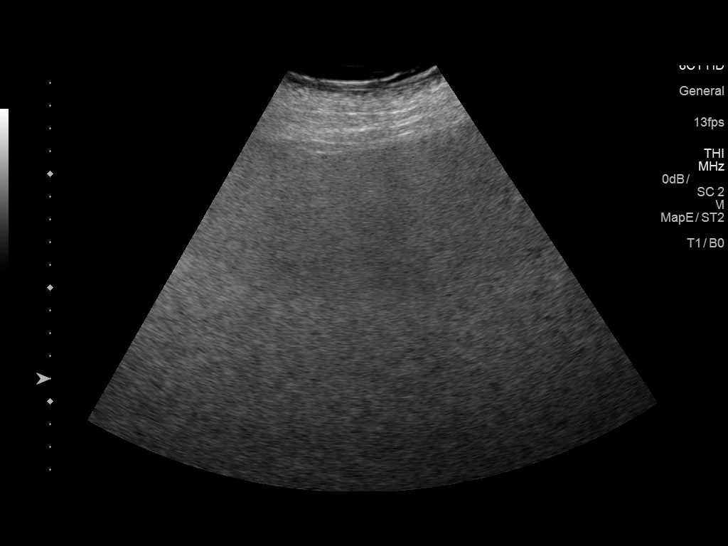

[14 of 25 positions shown; findings below may reference images not displayed]

FINDINGS: Gallbladder:

No gallstones or wall thickening visualized. No sonographic Murphy
sign noted by sonographer.

Common bile duct:

Diameter: Nonvisualized.  Patient is very large and difficult image.

Liver:

Limited liver evaluation. Liver is extremely echogenic and difficult
image by ultrasound. No focal lesion. Portal vein is patent on color
Doppler imaging with normal direction of blood flow towards the
liver.
IMPRESSION: Limited exam due to body habitus

Markedly echogenic liver.  No gallstones.

## 2020-02-14 DIAGNOSIS — Z789 Other specified health status: Secondary | ICD-10-CM | POA: Diagnosis not present

## 2020-02-14 DIAGNOSIS — E785 Hyperlipidemia, unspecified: Secondary | ICD-10-CM | POA: Diagnosis not present

## 2020-02-14 DIAGNOSIS — M5440 Lumbago with sciatica, unspecified side: Secondary | ICD-10-CM | POA: Diagnosis not present

## 2020-02-14 DIAGNOSIS — Z79899 Other long term (current) drug therapy: Secondary | ICD-10-CM | POA: Diagnosis not present

## 2020-02-14 DIAGNOSIS — I10 Essential (primary) hypertension: Secondary | ICD-10-CM | POA: Diagnosis not present

## 2020-02-14 DIAGNOSIS — E1169 Type 2 diabetes mellitus with other specified complication: Secondary | ICD-10-CM | POA: Diagnosis not present

## 2020-06-18 DIAGNOSIS — Z0000 Encounter for general adult medical examination without abnormal findings: Secondary | ICD-10-CM | POA: Diagnosis not present

## 2020-06-18 DIAGNOSIS — Z9181 History of falling: Secondary | ICD-10-CM | POA: Diagnosis not present

## 2020-06-18 DIAGNOSIS — E785 Hyperlipidemia, unspecified: Secondary | ICD-10-CM | POA: Diagnosis not present

## 2020-08-11 DIAGNOSIS — E1169 Type 2 diabetes mellitus with other specified complication: Secondary | ICD-10-CM | POA: Diagnosis not present

## 2020-08-11 DIAGNOSIS — E785 Hyperlipidemia, unspecified: Secondary | ICD-10-CM | POA: Diagnosis not present

## 2020-08-11 DIAGNOSIS — M5440 Lumbago with sciatica, unspecified side: Secondary | ICD-10-CM | POA: Diagnosis not present

## 2020-08-11 DIAGNOSIS — I10 Essential (primary) hypertension: Secondary | ICD-10-CM | POA: Diagnosis not present

## 2020-08-11 DIAGNOSIS — Z79899 Other long term (current) drug therapy: Secondary | ICD-10-CM | POA: Diagnosis not present

## 2020-08-11 DIAGNOSIS — Z789 Other specified health status: Secondary | ICD-10-CM | POA: Diagnosis not present

## 2020-08-11 DIAGNOSIS — Z981 Arthrodesis status: Secondary | ICD-10-CM | POA: Diagnosis not present

## 2020-08-18 ENCOUNTER — Encounter: Payer: Self-pay | Admitting: Gastroenterology

## 2020-11-03 ENCOUNTER — Encounter: Payer: Self-pay | Admitting: Nurse Practitioner

## 2020-11-24 DIAGNOSIS — M25561 Pain in right knee: Secondary | ICD-10-CM | POA: Diagnosis not present

## 2020-11-24 DIAGNOSIS — Z23 Encounter for immunization: Secondary | ICD-10-CM | POA: Diagnosis not present

## 2021-08-20 DIAGNOSIS — M545 Low back pain, unspecified: Secondary | ICD-10-CM | POA: Diagnosis not present

## 2021-08-20 DIAGNOSIS — I10 Essential (primary) hypertension: Secondary | ICD-10-CM | POA: Diagnosis not present

## 2021-08-20 DIAGNOSIS — Z880 Allergy status to penicillin: Secondary | ICD-10-CM | POA: Diagnosis not present

## 2021-08-24 DIAGNOSIS — E119 Type 2 diabetes mellitus without complications: Secondary | ICD-10-CM | POA: Diagnosis not present

## 2021-09-01 DIAGNOSIS — S39012A Strain of muscle, fascia and tendon of lower back, initial encounter: Secondary | ICD-10-CM | POA: Diagnosis not present

## 2021-09-01 DIAGNOSIS — Z7985 Long-term (current) use of injectable non-insulin antidiabetic drugs: Secondary | ICD-10-CM | POA: Diagnosis not present

## 2021-09-01 DIAGNOSIS — Z880 Allergy status to penicillin: Secondary | ICD-10-CM | POA: Diagnosis not present

## 2021-09-01 DIAGNOSIS — Z7984 Long term (current) use of oral hypoglycemic drugs: Secondary | ICD-10-CM | POA: Diagnosis not present

## 2021-09-01 DIAGNOSIS — Z79899 Other long term (current) drug therapy: Secondary | ICD-10-CM | POA: Diagnosis not present

## 2021-09-01 DIAGNOSIS — I10 Essential (primary) hypertension: Secondary | ICD-10-CM | POA: Diagnosis not present

## 2021-11-02 DIAGNOSIS — Z79891 Long term (current) use of opiate analgesic: Secondary | ICD-10-CM | POA: Diagnosis not present

## 2021-11-02 DIAGNOSIS — M542 Cervicalgia: Secondary | ICD-10-CM | POA: Diagnosis not present

## 2021-11-02 DIAGNOSIS — Z5181 Encounter for therapeutic drug level monitoring: Secondary | ICD-10-CM | POA: Diagnosis not present

## 2021-11-02 DIAGNOSIS — M5416 Radiculopathy, lumbar region: Secondary | ICD-10-CM | POA: Diagnosis not present

## 2021-11-16 DIAGNOSIS — M4726 Other spondylosis with radiculopathy, lumbar region: Secondary | ICD-10-CM | POA: Diagnosis not present

## 2021-11-29 DIAGNOSIS — Z5181 Encounter for therapeutic drug level monitoring: Secondary | ICD-10-CM | POA: Diagnosis not present

## 2021-11-29 DIAGNOSIS — M542 Cervicalgia: Secondary | ICD-10-CM | POA: Diagnosis not present

## 2021-11-29 DIAGNOSIS — M5416 Radiculopathy, lumbar region: Secondary | ICD-10-CM | POA: Diagnosis not present

## 2021-12-01 DIAGNOSIS — G629 Polyneuropathy, unspecified: Secondary | ICD-10-CM | POA: Diagnosis not present

## 2021-12-01 DIAGNOSIS — N138 Other obstructive and reflux uropathy: Secondary | ICD-10-CM | POA: Diagnosis not present

## 2021-12-01 DIAGNOSIS — H1013 Acute atopic conjunctivitis, bilateral: Secondary | ICD-10-CM | POA: Diagnosis not present

## 2021-12-01 DIAGNOSIS — J309 Allergic rhinitis, unspecified: Secondary | ICD-10-CM | POA: Diagnosis not present

## 2021-12-01 DIAGNOSIS — E119 Type 2 diabetes mellitus without complications: Secondary | ICD-10-CM | POA: Diagnosis not present

## 2021-12-01 DIAGNOSIS — I10 Essential (primary) hypertension: Secondary | ICD-10-CM | POA: Diagnosis not present

## 2021-12-01 DIAGNOSIS — E782 Mixed hyperlipidemia: Secondary | ICD-10-CM | POA: Diagnosis not present

## 2021-12-28 DIAGNOSIS — M542 Cervicalgia: Secondary | ICD-10-CM | POA: Diagnosis not present

## 2021-12-28 DIAGNOSIS — M5416 Radiculopathy, lumbar region: Secondary | ICD-10-CM | POA: Diagnosis not present

## 2022-01-03 DIAGNOSIS — I10 Essential (primary) hypertension: Secondary | ICD-10-CM | POA: Diagnosis not present

## 2022-01-03 DIAGNOSIS — E119 Type 2 diabetes mellitus without complications: Secondary | ICD-10-CM | POA: Diagnosis not present

## 2022-01-03 DIAGNOSIS — G8929 Other chronic pain: Secondary | ICD-10-CM | POA: Diagnosis not present

## 2022-01-03 DIAGNOSIS — E782 Mixed hyperlipidemia: Secondary | ICD-10-CM | POA: Diagnosis not present

## 2022-01-03 DIAGNOSIS — M5441 Lumbago with sciatica, right side: Secondary | ICD-10-CM | POA: Diagnosis not present

## 2022-01-03 DIAGNOSIS — M5442 Lumbago with sciatica, left side: Secondary | ICD-10-CM | POA: Diagnosis not present

## 2022-02-22 DIAGNOSIS — M542 Cervicalgia: Secondary | ICD-10-CM | POA: Diagnosis not present

## 2022-02-22 DIAGNOSIS — M5416 Radiculopathy, lumbar region: Secondary | ICD-10-CM | POA: Diagnosis not present

## 2022-02-22 DIAGNOSIS — Z79891 Long term (current) use of opiate analgesic: Secondary | ICD-10-CM | POA: Diagnosis not present

## 2022-04-07 DIAGNOSIS — I10 Essential (primary) hypertension: Secondary | ICD-10-CM | POA: Diagnosis not present

## 2022-04-07 DIAGNOSIS — J40 Bronchitis, not specified as acute or chronic: Secondary | ICD-10-CM | POA: Diagnosis not present

## 2022-04-07 DIAGNOSIS — L853 Xerosis cutis: Secondary | ICD-10-CM | POA: Diagnosis not present

## 2022-04-07 DIAGNOSIS — E119 Type 2 diabetes mellitus without complications: Secondary | ICD-10-CM | POA: Diagnosis not present

## 2022-04-07 DIAGNOSIS — E782 Mixed hyperlipidemia: Secondary | ICD-10-CM | POA: Diagnosis not present

## 2022-04-19 DIAGNOSIS — Z79891 Long term (current) use of opiate analgesic: Secondary | ICD-10-CM | POA: Diagnosis not present

## 2022-04-19 DIAGNOSIS — M542 Cervicalgia: Secondary | ICD-10-CM | POA: Diagnosis not present

## 2022-04-19 DIAGNOSIS — M5416 Radiculopathy, lumbar region: Secondary | ICD-10-CM | POA: Diagnosis not present

## 2022-06-15 DIAGNOSIS — M5416 Radiculopathy, lumbar region: Secondary | ICD-10-CM | POA: Diagnosis not present

## 2022-06-15 DIAGNOSIS — M542 Cervicalgia: Secondary | ICD-10-CM | POA: Diagnosis not present

## 2022-06-15 DIAGNOSIS — Z79891 Long term (current) use of opiate analgesic: Secondary | ICD-10-CM | POA: Diagnosis not present

## 2022-07-22 DIAGNOSIS — R21 Rash and other nonspecific skin eruption: Secondary | ICD-10-CM | POA: Diagnosis not present

## 2022-07-22 DIAGNOSIS — E782 Mixed hyperlipidemia: Secondary | ICD-10-CM | POA: Diagnosis not present

## 2022-08-10 DIAGNOSIS — M5416 Radiculopathy, lumbar region: Secondary | ICD-10-CM | POA: Diagnosis not present

## 2023-03-29 DIAGNOSIS — M47812 Spondylosis without myelopathy or radiculopathy, cervical region: Secondary | ICD-10-CM | POA: Diagnosis not present

## 2023-03-29 DIAGNOSIS — Z1389 Encounter for screening for other disorder: Secondary | ICD-10-CM | POA: Diagnosis not present

## 2023-03-29 DIAGNOSIS — M47816 Spondylosis without myelopathy or radiculopathy, lumbar region: Secondary | ICD-10-CM | POA: Diagnosis not present

## 2023-03-29 DIAGNOSIS — G894 Chronic pain syndrome: Secondary | ICD-10-CM | POA: Diagnosis not present

## 2023-04-26 DIAGNOSIS — M47816 Spondylosis without myelopathy or radiculopathy, lumbar region: Secondary | ICD-10-CM | POA: Diagnosis not present

## 2023-04-26 DIAGNOSIS — Z1389 Encounter for screening for other disorder: Secondary | ICD-10-CM | POA: Diagnosis not present

## 2023-04-26 DIAGNOSIS — M47812 Spondylosis without myelopathy or radiculopathy, cervical region: Secondary | ICD-10-CM | POA: Diagnosis not present

## 2023-04-26 DIAGNOSIS — G894 Chronic pain syndrome: Secondary | ICD-10-CM | POA: Diagnosis not present

## 2023-05-24 DIAGNOSIS — Z79891 Long term (current) use of opiate analgesic: Secondary | ICD-10-CM | POA: Diagnosis not present

## 2023-05-25 DIAGNOSIS — M5416 Radiculopathy, lumbar region: Secondary | ICD-10-CM | POA: Diagnosis not present

## 2023-05-25 DIAGNOSIS — Z79891 Long term (current) use of opiate analgesic: Secondary | ICD-10-CM | POA: Diagnosis not present

## 2023-05-25 DIAGNOSIS — M542 Cervicalgia: Secondary | ICD-10-CM | POA: Diagnosis not present
